# Patient Record
Sex: Male | Born: 2000
Health system: Southern US, Community
[De-identification: ages and names within clinical notes are randomized; demographics above are authoritative.]

## PROBLEM LIST (undated history)

## (undated) ENCOUNTER — Emergency Department (HOSPITAL_COMMUNITY): Payer: Self-pay | Source: Home / Self Care

## (undated) DIAGNOSIS — H9190 Unspecified hearing loss, unspecified ear: Secondary | ICD-10-CM

## (undated) HISTORY — PX: NO PAST SURGERIES: SHX2092

---
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
## 2022-02-16 ENCOUNTER — Ambulatory Visit: Payer: Commercial Managed Care - PPO

## 2022-02-16 ENCOUNTER — Ambulatory Visit
Admission: EM | Admit: 2022-02-16 | Discharge: 2022-02-16 | Disposition: A | Discharge status: Home / Self Care | Payer: Commercial Managed Care - PPO | Attending: Emergency Medicine | Admitting: Emergency Medicine

## 2022-02-16 ENCOUNTER — Ambulatory Visit (INDEPENDENT_AMBULATORY_CARE_PROVIDER_SITE_OTHER): Payer: Commercial Managed Care - PPO

## 2022-02-16 DIAGNOSIS — S0992XA Unspecified injury of nose, initial encounter: Secondary | ICD-10-CM

## 2022-02-16 DIAGNOSIS — S022XXA Fracture of nasal bones, initial encounter for closed fracture: Secondary | ICD-10-CM | POA: Diagnosis not present

## 2022-02-16 MED ORDER — OXYMETAZOLINE HCL 0.05 % NA SOLN: 1.0000 | Freq: Two times a day (BID) | NASAL | 0 refills | Status: AC
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
## 2022-12-25 ENCOUNTER — Encounter (HOSPITAL_COMMUNITY): Payer: Self-pay

## 2022-12-25 ENCOUNTER — Emergency Department (HOSPITAL_COMMUNITY): Payer: 59

## 2022-12-25 ENCOUNTER — Emergency Department (HOSPITAL_COMMUNITY)
Admission: EM | Admit: 2022-12-25 | Discharge: 2022-12-25 | Disposition: A | Discharge status: Home / Self Care | Payer: 59 | Attending: Emergency Medicine | Admitting: Emergency Medicine

## 2022-12-25 DIAGNOSIS — S0101XA Laceration without foreign body of scalp, initial encounter: Secondary | ICD-10-CM | POA: Insufficient documentation

## 2022-12-25 DIAGNOSIS — Z23 Encounter for immunization: Secondary | ICD-10-CM | POA: Diagnosis not present

## 2022-12-25 DIAGNOSIS — S0990XA Unspecified injury of head, initial encounter: Secondary | ICD-10-CM | POA: Diagnosis present

## 2022-12-25 MED ORDER — TETANUS-DIPHTH-ACELL PERTUSSIS 5-2.5-18.5 LF-MCG/0.5 IM SUSY
0.5000 mL | PREFILLED_SYRINGE | Freq: Once | INTRAMUSCULAR | Status: AC
  Administered 2022-12-25: 0.5 mL via INTRAMUSCULAR
  Filled 2022-12-25: qty 0.5
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
## 2022-12-25 NOTE — Discharge Instructions (Signed)
Kyle Green was seen in the ER for his head trauma. His laceration was repaired with staples, which will need to be removed in 5 days. He may take Tylenol 650 mg every 4 hours as needed for headache. It is very important that you rest over the next 48 hours. You should also not participate in any exercise or sports for at least 7 days and until you have no symptoms including headache, dizziness, nausea, or vomiting. Once your symptoms have resolved and you have gone at least 7 days following your injury, you may gradually return to light exercise as directed by your regular primary care provider. Return to the emergency department for repeat evaluation if you develop more than 2 episodes of vomiting, worsening headache despite use of Tylenol, difficulties with speech or balance, or other new concerns.
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
## 2022-12-25 NOTE — ED Notes (Signed)
Pt currently alert and oriented x4. Bleeding controlled. Reporting 6/10 pain on left side of head.

## 2022-12-29 ENCOUNTER — Ambulatory Visit: Admission: RE | Admit: 2022-12-29 | Discharge status: Against Medical Advice | Payer: 59 | Source: Ambulatory Visit

## 2022-12-29 NOTE — ED Notes (Signed)
Provider notified patient to come back tomorrow due to having arrived too early to have staples removed. No triage performed.

## 2022-12-30 ENCOUNTER — Ambulatory Visit
Admission: EM | Admit: 2022-12-30 | Discharge: 2022-12-30 | Disposition: A | Discharge status: Home / Self Care | Payer: 59 | Attending: Urgent Care | Admitting: Urgent Care

## 2022-12-30 VITALS — BP 101/68 | HR 77 | Temp 98.2°F | Resp 16

## 2022-12-30 DIAGNOSIS — S0101XA Laceration without foreign body of scalp, initial encounter: Secondary | ICD-10-CM | POA: Diagnosis not present

## 2022-12-30 DIAGNOSIS — S0101XS Laceration without foreign body of scalp, sequela: Secondary | ICD-10-CM

## 2022-12-30 NOTE — ED Triage Notes (Signed)
Here for staple removal from scalp from injury he was seen for in ED 5 days ago. Procedure notes describe two staples were placed. Patient denies any issues or complications from site. Wound edges appear well approximated. Provider evaluated patient. No redness, warmth or drainage from site. Removed both staples without complications. Unable to apply dressing due to hair around site. Gave patient aftercare instructions.

## 2022-12-30 NOTE — Discharge Instructions (Signed)
Follow up here or with your primary care provider if your symptoms are worsening or not improving.     

## 2022-12-30 NOTE — ED Provider Notes (Signed)
Kyle Green    CSN: [id number] Arrival date & time: 12/30/22  1243      History   Chief Complaint Chief Complaint  Patient presents with   Suture / Staple Removal    HPI Kyle Green is a 22 y.o. male.   HPI  Presents to ED for staple removal from scalp following injury.  Patient was seen in ED 5 days ago and told to present for staple removal.    Past Medical History:  Diagnosis Date   Hearing loss     Patient Active Problem List   Diagnosis Date Noted   Pneumomediastinum (HCC) 02/22/2020    Past Surgical History:  Procedure Laterality Date   NO PAST SURGERIES         Home Medications    Prior to Admission medications   Medication Sig Start Date End Date Taking? Authorizing Provider  Amphetamine-Dextroamphetamine (ADDERALL XR PO) Take by mouth.    [provider]  amphetamine-dextroamphetamine (ADDERALL XR) 30 MG 24 hr capsule TAKE 1 CAPSULE (30 MG) BY MOUTH ONCE A DAY 05/06/20 11/02/20  Page, Baxter Hire, MD  ibuprofen (ADVIL) 200 MG tablet Take 200 mg by mouth every 6 (six) hours as needed.    [provider]  lisdexamfetamine (VYVANSE) 20 MG capsule Take 20 mg by mouth daily.    [provider]  mupirocin ointment (BACTROBAN) 2 % Apply 1 application topically 2 (two) times daily. 03/02/21   Domenick Gong, MD  oxymetazoline (AFRIN NASAL SPRAY) 0.05 % nasal spray Place 1 spray into both nostrils 2 (two) times daily. 02/16/22   Valinda Hoar, NP    Family History No family history on file.  Social History Social History   Tobacco Use   Smoking status: Never   Smokeless tobacco: Never  Vaping Use   Vaping Use: Never used  Substance Use Topics   Alcohol use: No    Alcohol/week: 0.0 standard drinks of alcohol   Drug use: No     Allergies   Motrin [ibuprofen]   Review of Systems Review of Systems   Physical Exam Triage Vital Signs ED Triage Vitals  Enc Vitals Group     BP 12/30/22 1327 101/68      Pulse Rate 12/30/22 1327 77     Resp 12/30/22 1327 16     Temp 12/30/22 1327 98.2 F (36.8 C)     Temp Source 12/30/22 1327 Oral     SpO2 12/30/22 1327 97 %     Weight --      Height --      Head Circumference --      Peak Flow --      Pain Score 12/30/22 1328 0     Pain Loc --      Pain Edu? --      Excl. in GC? --    No data found.  Updated Vital Signs BP 101/68 (BP Location: Left Arm)   Pulse 77   Temp 98.2 F (36.8 C) (Oral)   Resp 16   SpO2 97%   Visual Acuity Right Eye Distance:   Left Eye Distance:   Bilateral Distance:    Right Eye Near:   Left Eye Near:    Bilateral Near:     Physical Exam   UC Treatments / Results  Labs (all labs ordered are listed, but only abnormal results are displayed) Labs Reviewed - No data to display  EKG   Radiology No results found.  Procedures Procedures (including  critical care time)  Medications Ordered in UC Medications - No data to display  Initial Impression / Assessment and Plan / UC Course  I have reviewed the triage vital signs and the nursing notes.  Pertinent labs & imaging results that were available during my care of the patient were reviewed by me and considered in my medical decision making (see chart for details).   2 staples were removed from the patient scalp without complication.  Wound edges are well-approximated.  No redness, warmth, drainage from the site.  Aftercare instructions provided.   Final Clinical Impressions(s) / UC Diagnoses   Final diagnoses:  None   Discharge Instructions   None    ED Prescriptions   None    PDMP not reviewed this encounter.   Charma Igo, [place] 12/30/22 1356
